# Patient Record
Sex: Male | Born: 2005 | Race: White | Hispanic: No | Marital: Single | Smoking: Never smoker
Health system: Southern US, Community
[De-identification: ages and names within clinical notes are randomized; demographics above are authoritative.]

## PROBLEM LIST (undated history)

## (undated) DIAGNOSIS — J45909 Unspecified asthma, uncomplicated: Secondary | ICD-10-CM

## (undated) HISTORY — DX: Unspecified asthma, uncomplicated: J45.909

## (undated) HISTORY — PX: ADENOIDECTOMY: SUR15

## (undated) HISTORY — PX: TYMPANOSTOMY TUBE PLACEMENT: SHX32

---
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
## 2023-05-01 ENCOUNTER — Telehealth: Payer: Self-pay

## 2023-05-01 ENCOUNTER — Other Ambulatory Visit (HOSPITAL_COMMUNITY): Payer: Self-pay

## 2023-05-01 NOTE — Telephone Encounter (Signed)
Pts insurance will only cover if pt tried and fails, arnuity, amanex twist or hfa, and qvar redihaler. Advise to change in therapy

## 2023-05-01 NOTE — Telephone Encounter (Signed)
Patient Advocate Encounter  Received a fax from [employer] regarding Prior Authorization for  Fluticasone Propionate HFA 110MCG/ACT aerosol.   Key [id number]   Authorization has been DENIED due to    Determination letter attached to patient chart

## 2023-05-01 NOTE — Telephone Encounter (Signed)
Patient Advocate Encounter   Received notification from [employer] that prior authorization is required for Fluticasone Propionate HFA 110MCG/ACT aerosol   Submitted: 05-01-2023 Key [id number]  Status is pending

## 2023-06-26 ENCOUNTER — Ambulatory Visit: Payer: BC Managed Care – PPO | Admitting: Internal Medicine

## 2023-06-26 VITALS — BP 116/60 | HR 80 | Temp 98.8°F | Resp 1 | Wt 133.2 lb

## 2023-06-26 DIAGNOSIS — J4530 Mild persistent asthma, uncomplicated: Secondary | ICD-10-CM

## 2023-06-26 DIAGNOSIS — T7800XD Anaphylactic reaction due to unspecified food, subsequent encounter: Secondary | ICD-10-CM

## 2023-06-26 DIAGNOSIS — J3089 Other allergic rhinitis: Secondary | ICD-10-CM

## 2023-06-26 DIAGNOSIS — T7800XA Anaphylactic reaction due to unspecified food, initial encounter: Secondary | ICD-10-CM

## 2023-06-26 DIAGNOSIS — J302 Other seasonal allergic rhinitis: Secondary | ICD-10-CM

## 2023-06-26 MED ORDER — CETIRIZINE HCL 10 MG PO TABS: 10.0000 mg | ORAL_TABLET | Freq: Every day | ORAL | 3 refills | Status: AC | PRN

## 2023-06-26 MED ORDER — ALBUTEROL SULFATE HFA 108 (90 BASE) MCG/ACT IN AERS: 2.0000 | INHALATION_SPRAY | RESPIRATORY_TRACT | 0 refills | Status: DC | PRN

## 2023-06-26 MED ORDER — EPINEPHRINE 0.3 MG/0.3ML IJ SOAJ: 0.3000 mg | INTRAMUSCULAR | 2 refills | Status: DC | PRN

## 2023-06-26 MED ORDER — FLOVENT HFA 110 MCG/ACT IN AERO: INHALATION_SPRAY | RESPIRATORY_TRACT | 5 refills | Status: DC

## 2023-06-26 MED ORDER — MONTELUKAST SODIUM 10 MG PO TABS: ORAL_TABLET | ORAL | 1 refills | Status: AC

## 2023-06-26 NOTE — Progress Notes (Signed)
Follow Up Note  RE: Aaron Hensley MRN: [id number] DOB: [birth year] Date of Office Visit: 06/26/2023  Referring provider: No ref. provider found Primary care provider: Joanna Hews, MD (Inactive)  Chief Complaint: No chief complaint on file.  History of Present Illness: I had the pleasure of seeing Aaron Hensley for a follow up visit at the [facility] on 06/26/2023. He is a 17 y.o. male, who is being followed for Mild intermittent asthma, allergic rhinitis, milk and egg allergy. His previous allergy office visit was on 06/20/22 with Dr. Marlynn Perking. Today is a regular follow up visit.  History obtained from patient.  ASTHMA - Medical therapy: None current  - Rescue inhaler use:4 times a week  - Symptoms: dyspnea triggered by exercise and dusty environments  - Exacerbation history: 0 ABX for respiratory illness since last visit, 0 OCS, 0ED, 0  [facility] visits in the past year  - ACT: 18 /25 - Adverse effects of medication: denies  - PreviousFEV1: 3.28L, 76% predicted - Biologic Labs none needed   Food Allergy: continues to avoid milk and egg in all forms - 0 accidental exposures - 0 use of epinephrine -Previous testing: None current, not interested in updated  - Last reaction occurred 2 years ago with oatmeal -needs refills of epipen  Allergic rhinitis: current therapy: occasionally zyrtec (once a month ,  symptoms improved symptoms include:  denies Previous allergy testing: yes History of reflux/heartburn: no Interested in Allergy Immunotherapy: no   Assessment and Plan: Aaron Hensley is a 17 y.o. male with: Not well controlled mild persistent asthma - Plan: Spirometry with Graph  Seasonal and perennial allergic rhinitis  Allergy with anaphylaxis due to food Plan: Patient Instructions  Asthma-  not well controlled  Breathing tests showed inflammation in your lungs Start Flovent 2 puffs twice daily   -this is taking every day  -rinse mouth out  after use  Start montelukast 10mg  at night Continue albuterol 2 puffs every 4 hours as needed for cough or wheeze OR Instead use albuterol 0.083% solution via nebulizer one unit vial every 4 hours as needed for cough or wheeze For asthma flare, begin Flovent 110-2 puffs twice a day with a spacer for 2 weeks or until cough and wheeze free.  Can restart singulair 10mg  daily for more mild symptoms    Allergic rhinitis; well controlled  Continue allergen avoidance measures Continue with over-the-counter antihistamine once a day as needed for runny nose or itch. Some examples of over the counter antihistamines include Zyrtec (cetirizine), Xyzal (levocetirizine), Allegra (fexofenadine), and Claritin (loratidine).  Consider saline nasal rinses as needed for nasal symptoms. Use this before any medicated nasal sprays for best result  Food allergy Recommend updating food testing to evaluate if you can tolerate baked egg and baked milk  Continue to avoid cow's milk and eggs in all forms.  In case of an allergic reaction, give Benadryl 50 mg every 4 hours, and if life-threatening symptoms occur, inject with EpiPen 0.3 mg.  Call the clinic if this treatment plan is not working well for you.   Follow up: 3 months   Thank you so much for letting me partake in your care today.  Don't hesitate to reach out if you have any additional concerns!  Ferol Luz, MD  [facility] [place] No follow-ups on file.  Meds ordered this encounter  Medications   albuterol (VENTOLIN HFA) 108 (90 Base) MCG/ACT inhaler    Sig: Inhale  2 puffs into the lungs every 4 (four) hours as needed for wheezing or shortness of breath.    Dispense:  3 each    Refill:  0    90 days   cetirizine (ZYRTEC) 10 MG tablet    Sig: Take 1 tablet (10 mg total) by mouth daily as needed.    Dispense:  90 tablet    Refill:  3   EPINEPHrine (AUVI-Q) 0.3 mg/0.3 mL IJ SOAJ injection    Sig: Inject 0.3 mg into the  muscle as needed for anaphylaxis.    Dispense:  1 each    Refill:  2   montelukast (SINGULAIR) 10 MG tablet    Sig: TAKE 1 TABLET(10 MG) BY MOUTH AT BEDTIME    Dispense:  90 tablet    Refill:  1   FLOVENT HFA 110 MCG/ACT inhaler    Sig: INHALE 2 PUFFS BY MOUTH TWICE DAILY FOR 2 WEEKS OR UNTIL COUGH OR WHEEZE FREE    Dispense:  12 g    Refill:  5    Lab Orders  No laboratory test(s) ordered today   Diagnostics: Spirometry:  Tracings reviewed. His effort: Good reproducible efforts. FVC: 3.66L FEV1: 3.03L, 70% predicted FEV1/FVC ratio: 80% Interpretation: Spirometry consistent with possible restrictive disease.  After 4 puffs of albuterol FEV1 increased by 280 cc and 9%, FVC increased by 340 cc and 9% this is not a significant postbronchodilator response Please see scanned spirometry results for details.   Results interpreted by myself during this encounter and discussed with patient/family.   Medication List:  Current Outpatient Medications  Medication Sig Dispense Refill   albuterol (PROVENTIL) (2.5 MG/3ML) 0.083% nebulizer solution Take 3 mLs (2.5 mg total) by nebulization every 4 (four) hours as needed for wheezing or shortness of breath. 75 mL 1   albuterol (VENTOLIN HFA) 108 (90 Base) MCG/ACT inhaler Inhale 2 puffs into the lungs every 4 (four) hours as needed for wheezing or shortness of breath. 3 each 0   cetirizine (ZYRTEC) 10 MG tablet Take 1 tablet (10 mg total) by mouth daily as needed. 90 tablet 3   EPINEPHrine (AUVI-Q) 0.3 mg/0.3 mL IJ SOAJ injection Inject 0.3 mg into the muscle as needed for anaphylaxis. 1 each 2   FLOVENT HFA 110 MCG/ACT inhaler INHALE 2 PUFFS BY MOUTH TWICE DAILY FOR 2 WEEKS OR UNTIL COUGH OR WHEEZE FREE 12 g 5   montelukast (SINGULAIR) 10 MG tablet TAKE 1 TABLET(10 MG) BY MOUTH AT BEDTIME 90 tablet 1   No current facility-administered medications for this visit.   Allergies: Allergies  Allergen Reactions   Egg-Derived Products     Milk-Related Compounds    I reviewed his past medical history, social history, family history, and environmental history and no significant changes have been reported from his previous visit.  ROS: All others negative except as noted per HPI.   Objective: BP 116/60   Pulse 80   Temp 98.8 F (37.1 C) (Temporal)   Resp (!) 1   Wt 133 lb 3.2 oz (60.4 kg)   BMI 20.25 kg/m  Body mass index is 20.25 kg/m. General Appearance:  Alert, cooperative, no distress, appears stated age  Head:  Normocephalic, without obvious abnormality, atraumatic  Eyes:  Conjunctiva clear, EOM's intact  Nose: Nares normal, normal mucosa, no visible anterior polyps, and septum midline  Throat: Lips, tongue normal; teeth and gums normal, normal posterior oropharynx  Neck: Supple, symmetrical  Lungs:   clear to auscultation bilaterally, Respirations unlabored,  no coughing  Heart:  regular rate and rhythm and no murmur, Appears well perfused  Extremities: No edema  Skin: Skin color, texture, turgor normal, no rashes or lesions on visualized portions of skin  Neurologic: No gross deficits   Previous notes and tests were reviewed. The plan was reviewed with the patient/family, and all questions/concerned were addressed.  It was my pleasure to see Aaron Hensley today and participate in his care. Please feel free to contact me with any questions or concerns.  Sincerely,  Ferol Luz, MD  Allergy & Immunology  [facility] Office: [phone]

## 2023-06-26 NOTE — Patient Instructions (Addendum)
Asthma-  not well controlled  Breathing tests showed inflammation in your lungs Start Flovent 2 puffs twice daily   -this is taking every day  -rinse mouth out after use  Start montelukast 10mg  at night Continue albuterol 2 puffs every 4 hours as needed for cough or wheeze OR Instead use albuterol 0.083% solution via nebulizer one unit vial every 4 hours as needed for cough or wheeze For asthma flare, begin Flovent 110-2 puffs twice a day with a spacer for 2 weeks or until cough and wheeze free.  Can restart singulair 10mg  daily for more mild symptoms    Allergic rhinitis; well controlled  Continue allergen avoidance measures Continue with over-the-counter antihistamine once a day as needed for runny nose or itch. Some examples of over the counter antihistamines include Zyrtec (cetirizine), Xyzal (levocetirizine), Allegra (fexofenadine), and Claritin (loratidine).  Consider saline nasal rinses as needed for nasal symptoms. Use this before any medicated nasal sprays for best result  Food allergy Recommend updating food testing to evaluate if you can tolerate baked egg and baked milk  Continue to avoid cow's milk and eggs in all forms.  In case of an allergic reaction, give Benadryl 50 mg every 4 hours, and if life-threatening symptoms occur, inject with EpiPen 0.3 mg.  Call the clinic if this treatment plan is not working well for you.   Follow up: 3 months   Thank you so much for letting me partake in your care today.  Don't hesitate to reach out if you have any additional concerns!  Ferol Luz, MD  [facility] [place] [place]

## 2023-07-10 ENCOUNTER — Telehealth: Payer: Self-pay

## 2023-07-10 MED ORDER — ASMANEX HFA 100 MCG/ACT IN AERO: 2.0000 | INHALATION_SPRAY | Freq: Two times a day (BID) | RESPIRATORY_TRACT | 5 refills | Status: AC

## 2023-07-10 NOTE — Telephone Encounter (Signed)
Pts insurance does not cover flovent advise to change to arnuity, asmanex, qvar thank you

## 2023-07-10 NOTE — Telephone Encounter (Signed)
Sent in asmanex 2 puffs bid

## 2023-07-10 NOTE — Telephone Encounter (Signed)
Lets do asmanex 2 puffs twice daily. Thanks!

## 2023-07-10 NOTE — Addendum Note (Signed)
Addended by: Berna Bue on: 07/10/2023 01:33 PM   Modules accepted: Orders

## 2023-07-20 ENCOUNTER — Other Ambulatory Visit: Payer: Self-pay | Admitting: Internal Medicine

## 2023-08-11 MED ORDER — FLUTICASONE PROPIONATE HFA 110 MCG/ACT IN AERO: 2.0000 | INHALATION_SPRAY | Freq: Two times a day (BID) | RESPIRATORY_TRACT | 5 refills | Status: DC

## 2023-08-11 NOTE — Telephone Encounter (Signed)
Asmanex is not on pts formulary fluticasone and pulmicort and qvar are preferred

## 2023-08-11 NOTE — Telephone Encounter (Signed)
Fluticasone 2 puffs twice daily

## 2023-08-11 NOTE — Addendum Note (Signed)
Addended by: Berna Bue on: 08/11/2023 02:23 PM   Modules accepted: Orders

## 2023-08-11 NOTE — Telephone Encounter (Signed)
Sent in fluticasone 110 2 puffs bid

## 2023-11-06 ENCOUNTER — Other Ambulatory Visit (HOSPITAL_COMMUNITY): Payer: Self-pay

## 2023-11-09 ENCOUNTER — Telehealth: Payer: Self-pay

## 2023-11-09 ENCOUNTER — Other Ambulatory Visit (HOSPITAL_COMMUNITY): Payer: Self-pay

## 2023-11-09 NOTE — Telephone Encounter (Signed)
Pharmacy Patient Advocate Encounter   Received notification from Patient Pharmacy that prior authorization for Asmanex HFA 100MCG/ACT aerosol is required/requested.   Insurance verification completed.   The patient is insured through [employer] .   Prior Authorization form/request asks a question that requires your assistance. Please see the question below and advise accordingly.

## 2023-11-09 NOTE — Telephone Encounter (Signed)
Dr. Marlynn Perking please advise- appears Flovent HFA is preferred.

## 2023-11-10 MED ORDER — FLUTICASONE PROPIONATE HFA 110 MCG/ACT IN AERO: 1.0000 | INHALATION_SPRAY | Freq: Two times a day (BID) | RESPIRATORY_TRACT | 5 refills | Status: DC

## 2023-11-10 NOTE — Telephone Encounter (Signed)
Sent in flovent for pt

## 2023-11-10 NOTE — Telephone Encounter (Signed)
We can do Flovent 1 puff twice dialy

## 2023-11-10 NOTE — Telephone Encounter (Signed)
Left a message for a return call. Need to inform parent that Flovent inhaler was sent to [facility] and this will replace the Asmanex inhaler since the Asmanex was not covered.

## 2024-01-11 ENCOUNTER — Other Ambulatory Visit: Payer: Self-pay

## 2024-03-24 ENCOUNTER — Other Ambulatory Visit: Payer: Self-pay | Admitting: Internal Medicine

## 2024-04-01 ENCOUNTER — Other Ambulatory Visit: Payer: Self-pay | Admitting: *Deleted

## 2024-04-01 MED ORDER — FLUTICASONE PROPIONATE HFA 110 MCG/ACT IN AERO: 2.0000 | INHALATION_SPRAY | Freq: Two times a day (BID) | RESPIRATORY_TRACT | 3 refills | Status: DC

## 2024-06-26 ENCOUNTER — Ambulatory Visit: Payer: BC Managed Care – PPO | Admitting: Internal Medicine

## 2024-07-03 ENCOUNTER — Encounter: Payer: Self-pay | Admitting: Internal Medicine

## 2024-07-03 ENCOUNTER — Ambulatory Visit: Payer: Self-pay | Admitting: Internal Medicine

## 2024-07-03 VITALS — BP 120/72 | HR 74 | Temp 96.0°F | Resp 20 | Ht 67.0 in | Wt 130.3 lb

## 2024-07-03 DIAGNOSIS — J302 Other seasonal allergic rhinitis: Secondary | ICD-10-CM | POA: Diagnosis not present

## 2024-07-03 DIAGNOSIS — J3089 Other allergic rhinitis: Secondary | ICD-10-CM | POA: Diagnosis not present

## 2024-07-03 DIAGNOSIS — T7800XD Anaphylactic reaction due to unspecified food, subsequent encounter: Secondary | ICD-10-CM

## 2024-07-03 DIAGNOSIS — T7800XA Anaphylactic reaction due to unspecified food, initial encounter: Secondary | ICD-10-CM

## 2024-07-03 DIAGNOSIS — J4530 Mild persistent asthma, uncomplicated: Secondary | ICD-10-CM

## 2024-07-03 MED ORDER — FLUTICASONE PROPIONATE HFA 110 MCG/ACT IN AERO: 2.0000 | INHALATION_SPRAY | Freq: Two times a day (BID) | RESPIRATORY_TRACT | 3 refills | Status: AC

## 2024-07-03 MED ORDER — EPINEPHRINE 0.3 MG/0.3ML IJ SOAJ: 0.3000 mg | INTRAMUSCULAR | 1 refills | Status: AC | PRN

## 2024-07-03 MED ORDER — FLOVENT HFA 110 MCG/ACT IN AERO: INHALATION_SPRAY | RESPIRATORY_TRACT | 5 refills | Status: AC

## 2024-07-03 MED ORDER — ALBUTEROL SULFATE HFA 108 (90 BASE) MCG/ACT IN AERS: 2.0000 | INHALATION_SPRAY | RESPIRATORY_TRACT | 0 refills | Status: AC | PRN

## 2024-07-03 MED ORDER — ALBUTEROL SULFATE (2.5 MG/3ML) 0.083% IN NEBU: 2.5000 mg | INHALATION_SOLUTION | RESPIRATORY_TRACT | 1 refills | Status: AC | PRN

## 2024-07-03 NOTE — Patient Instructions (Addendum)
 Asthma-   Breathing tests Continue Flovent  110mcg 2 puffs twice daily  Continue montelukast  10mg  at night Continue albuterol  2 puffs every 4 hours as needed for cough or wheeze OR Instead use albuterol  0.083% solution via nebulizer one unit vial every 4 hours as needed for cough or wheeze For asthma flare, begin Flovent  110-2 puffs twice a day with a spacer for 2 weeks or until cough and wheeze free.  Can restart singulair  10mg  daily for more mild symptoms    Allergic rhinitis; well controlled  Continue allergen avoidance measures Continue with over-the-counter antihistamine once a day as needed for runny nose or itch. Some examples of over the counter antihistamines include Zyrtec  (cetirizine ), Xyzal (levocetirizine), Allegra (fexofenadine), and Claritin (loratidine).  Consider saline nasal rinses as needed for nasal symptoms. Use this before any medicated nasal sprays for best result  Food allergy Will update food testing to evaluate if you can tolerate baked egg and baked milk  Continue to avoid cow's milk and eggs in all forms.  In case of an allergic reaction, give Benadryl 50 mg every 4 hours, and if life-threatening symptoms occur, inject with EpiPen  0.3 mg.  Call the clinic if this treatment plan is not working well for you.   Follow up: 12 months in clinic  We will call you with lab results and next steps in food allergy evaluation   Thank you so much for letting me partake in your care today.  Don't hesitate to reach out if you have any additional concerns!  Hargis Springer, MD  [facility] [place] [place]

## 2024-07-03 NOTE — Progress Notes (Signed)
 Follow Up Note  RE: Aaron Hensley MRN: [id number] DOB: 2006-03-30 Date of Office Visit: 07/03/2024  Referring provider: No ref. provider found Primary care provider: Lesleigh Klinefelter, MD (Inactive)  Chief Complaint: Follow-up (Occasional flares when running or when he stays overnight anywhere cold.everything good, needs refills. )  History of Present Illness: I had the pleasure of seeing [name] for a follow up visit at the [facility] on 07/03/2024. He is a 18 y.o. male, who is being followed for Mild intermittent asthma, allergic rhinitis, milk and egg allergy. His previous allergy office visit was on 06/26/23 with Dr. Lorin. Today is a regular follow up visit.  History obtained from patient.  ASTHMA - Medical therapy: Flovent  110mcg 1 puffs twice daily, montelukast  10mg  at night  - Rescue inhaler use:1 time per week  - Symptoms: rare dyspnea,  - Exacerbation history: 0 ABX for respiratory illness since last visit, 0 OCS, 0ED, 0  [facility] visits in the past year  - ACT: 18 /25 - Adverse effects of medication: denies  - PreviousFEV1: 3.03L, 70% predicted - Biologic Labs none needed   Food Allergy: continues to avoid milk and egg in all forms - 0 accidental exposures - 0 use of epinephrine  -Previous testing: None current, interested in updating test  - Last reaction occurred 4 years ago with oatmeal with milk caused vomiting -needs refills of epipen   Allergic rhinitis: current therapy: occasionally zyrtec   symptoms improved symptoms include: denies Previous allergy testing: yes History of reflux/heartburn: no Interested in Allergy Immunotherapy: no   Assessment and Plan: Shan is a 18 y.o. male with: Seasonal and perennial allergic rhinitis  Mild persistent asthma without complication  Allergy with anaphylaxis due to food - Plan: IgE Milk w/ Component Reflex, Allergen, Egg White f1, Egg Component Panel Plan: Patient Instructions  Asthma-    Breathing tests Continue Flovent  110mcg 2 puffs twice daily  Continue montelukast  10mg  at night Continue albuterol  2 puffs every 4 hours as needed for cough or wheeze OR Instead use albuterol  0.083% solution via nebulizer one unit vial every 4 hours as needed for cough or wheeze For asthma flare, begin Flovent  110-2 puffs twice a day with a spacer for 2 weeks or until cough and wheeze free.  Can restart singulair  10mg  daily for more mild symptoms    Allergic rhinitis; well controlled  Continue allergen avoidance measures Continue with over-the-counter antihistamine once a day as needed for runny nose or itch. Some examples of over the counter antihistamines include Zyrtec  (cetirizine ), Xyzal (levocetirizine), Allegra (fexofenadine), and Claritin (loratidine).  Consider saline nasal rinses as needed for nasal symptoms. Use this before any medicated nasal sprays for best result  Food allergy Will update food testing to evaluate if you can tolerate baked egg and baked milk  Continue to avoid cow's milk and eggs in all forms.  In case of an allergic reaction, give Benadryl 50 mg every 4 hours, and if life-threatening symptoms occur, inject with EpiPen  0.3 mg.  Call the clinic if this treatment plan is not working well for you.   Follow up: 12 months in clinic  We will call you with lab results and next steps in food allergy evaluation   Thank you so much for letting me partake in your care today.  Don't hesitate to reach out if you have any additional concerns!  [name], MD  [facility] [place] [place] No follow-ups on file.  Meds ordered this encounter  Medications   albuterol  (VENTOLIN  HFA) 108 (90 Base) MCG/ACT inhaler    Sig: Inhale 2 puffs into the lungs every 4 (four) hours as needed for wheezing or shortness of breath.    Dispense:  3 each    Refill:  0    90 days   FLOVENT  HFA 110 MCG/ACT inhaler    Sig: INHALE 2 PUFFS BY MOUTH TWICE DAILY FOR 2 WEEKS  OR UNTIL COUGH OR WHEEZE FREE    Dispense:  12 g    Refill:  5   albuterol  (PROVENTIL ) (2.5 MG/3ML) 0.083% nebulizer solution    Sig: Take 3 mLs (2.5 mg total) by nebulization every 4 (four) hours as needed for wheezing or shortness of breath.    Dispense:  75 mL    Refill:  1   EPINEPHrine  0.3 mg/0.3 mL IJ SOAJ injection    Sig: Inject 0.3 mg into the muscle as needed for anaphylaxis.    Dispense:  2 each    Refill:  1   fluticasone  (FLOVENT  HFA) 110 MCG/ACT inhaler    Sig: Inhale 2 puffs into the lungs 2 (two) times daily.    Dispense:  1 each    Refill:  3    Keep ov for 06/26/24    Lab Orders         IgE Milk w/ Component Reflex         Allergen, Egg White f1         Egg Component Panel     Diagnostics: Spirometry:  Tracings reviewed. His effort: Good reproducible efforts. FVC: 4.25L FEV1: 3.56L, 89% predicted FEV1/FVC ratio: 84% Interpretation: Spirometry consistent with possible restrictive disease.   Please see scanned spirometry results for details.   Results interpreted by myself during this encounter and discussed with patient/family.   Medication List:  Current Outpatient Medications  Medication Sig Dispense Refill   cetirizine  (ZYRTEC ) 10 MG tablet Take 1 tablet (10 mg total) by mouth daily as needed. 90 tablet 3   montelukast  (SINGULAIR ) 10 MG tablet TAKE 1 TABLET(10 MG) BY MOUTH AT BEDTIME 90 tablet 1   albuterol  (PROVENTIL ) (2.5 MG/3ML) 0.083% nebulizer solution Take 3 mLs (2.5 mg total) by nebulization every 4 (four) hours as needed for wheezing or shortness of breath. 75 mL 1   albuterol  (VENTOLIN  HFA) 108 (90 Base) MCG/ACT inhaler Inhale 2 puffs into the lungs every 4 (four) hours as needed for wheezing or shortness of breath. 3 each 0   EPINEPHrine  0.3 mg/0.3 mL IJ SOAJ injection Inject 0.3 mg into the muscle as needed for anaphylaxis. 2 each 1   FLOVENT  HFA 110 MCG/ACT inhaler INHALE 2 PUFFS BY MOUTH TWICE DAILY FOR 2 WEEKS OR UNTIL COUGH OR WHEEZE FREE  12 g 5   fluticasone  (FLOVENT  HFA) 110 MCG/ACT inhaler Inhale 1 puff into the lungs 2 (two) times daily. (Patient not taking: Reported on 07/03/2024) 1 each 5   fluticasone  (FLOVENT  HFA) 110 MCG/ACT inhaler Inhale 2 puffs into the lungs 2 (two) times daily. 1 each 3   Mometasone Furoate  (ASMANEX  HFA) 100 MCG/ACT AERO Inhale 2 puffs into the lungs 2 (two) times daily. (Patient not taking: Reported on 07/03/2024) 13 g 5   No current facility-administered medications for this visit.   Allergies: Allergies  Allergen Reactions   Egg-Derived Products    Milk-Related Compounds    I reviewed his past medical history, social history, family history, and environmental history and no significant changes have been reported from his previous  visit.  ROS: All others negative except as noted per HPI.   Objective: BP 120/72 (BP Location: Left Arm, Patient Position: Sitting)   Pulse 74   Temp (!) 96 F (35.6 C) (Temporal)   Resp 20   Ht 5' 7 (1.702 m)   Wt 130 lb 4.8 oz (59.1 kg)   SpO2 97%   BMI 20.41 kg/m  Body mass index is 20.41 kg/m. General Appearance:  Alert, cooperative, no distress, appears stated age  Head:  Normocephalic, without obvious abnormality, atraumatic  Eyes:  Conjunctiva clear, EOM's intact  Nose: Nares normal, normal mucosa, no visible anterior polyps, and septum midline  Throat: Lips, tongue normal; teeth and gums normal, normal posterior oropharynx  Neck: Supple, symmetrical  Lungs:   clear to auscultation bilaterally, Respirations unlabored, no coughing  Heart:  regular rate and rhythm and no murmur, Appears well perfused  Extremities: No edema  Skin: Skin color, texture, turgor normal, no rashes or lesions on visualized portions of skin  Neurologic: No gross deficits   Previous notes and tests were reviewed. The plan was reviewed with the patient/family, and all questions/concerned were addressed.  It was my pleasure to see Aaron Hensley today and participate in his care.  Please feel free to contact me with any questions or concerns.  Sincerely,  Hargis Springer, MD  Allergy & Immunology  [facility]  [place] Office: [phone]

## 2024-07-03 NOTE — Addendum Note (Signed)
 Addended by: GRETEL KRABBE on: 07/03/2024 04:32 PM   Modules accepted: Orders

## 2024-07-04 NOTE — Addendum Note (Signed)
 Addended by: WILLIAMS, Osbaldo Mark H on: 07/04/2024 05:00 PM   Modules accepted: Orders

## 2024-07-05 ENCOUNTER — Other Ambulatory Visit: Payer: Self-pay | Admitting: *Deleted

## 2024-07-05 LAB — IGE MILK W/ COMPONENT REFLEX

## 2024-07-05 MED ORDER — FLOVENT HFA 110 MCG/ACT IN AERO
1.0000 | INHALATION_SPRAY | Freq: Two times a day (BID) | RESPIRATORY_TRACT | 5 refills | Status: DC
Start: 2024-07-05 — End: 2024-07-11

## 2024-07-07 LAB — IGE MILK W/ COMPONENT REFLEX: F002-IgE Milk: 76.6 kU/L — AB

## 2024-07-07 LAB — PANEL 603848
F076-IgE Alpha Lactalbumin: 11.9 kU/L — AB
F077-IgE Beta Lactoglobulin: 14.6 kU/L — AB
F078-IgE Casein: 52.5 kU/L — AB

## 2024-07-07 LAB — ALLERGEN EGG WHITE F1: Egg White IgE: 2.38 kU/L — AB

## 2024-07-07 LAB — EGG COMPONENT PANEL
F232-IgE Ovalbumin: 3.3 kU/L — AB
F233-IgE Ovomucoid: 0.11 kU/L — AB

## 2024-07-07 LAB — ALLERGEN COMPONENT COMMENTS

## 2024-07-11 MED ORDER — FLUTICASONE PROPIONATE HFA 110 MCG/ACT IN AERO: 1.0000 | INHALATION_SPRAY | Freq: Two times a day (BID) | RESPIRATORY_TRACT | 5 refills | Status: AC

## 2024-07-11 NOTE — Addendum Note (Signed)
 Addended by: ONEITA CHRISTIANS D on: 07/11/2024 10:16 AM   Modules accepted: Orders

## 2024-07-11 NOTE — Telephone Encounter (Signed)
 Received fax from [facility]- Brand Flovent  is not made anymore. I resent as generic.

## 2024-07-25 ENCOUNTER — Ambulatory Visit: Payer: Self-pay | Admitting: Internal Medicine

## 2024-07-25 NOTE — Progress Notes (Signed)
 Blood work for milk was still very significantly positive recommend strict avoidance of milk in all forms.  Egg was more promising.  If he is interested we can set up a baked milk food challenge.

## 2025-07-07 ENCOUNTER — Ambulatory Visit: Admitting: Internal Medicine
